# Patient Record
Sex: Male | Born: 1960 | Hispanic: No | Marital: Married | Smoking: Never smoker
Health system: Southern US, Community
[De-identification: ages and names within clinical notes are randomized; demographics above are authoritative.]

---

## 2006-03-31 ENCOUNTER — Encounter: Admission: RE | Admit: 2006-03-31 | Discharge: 2006-03-31 | Payer: Self-pay | Admitting: Specialist

## 2012-11-27 ENCOUNTER — Emergency Department (HOSPITAL_COMMUNITY): Payer: 59

## 2012-11-27 ENCOUNTER — Emergency Department (HOSPITAL_COMMUNITY)
Admission: EM | Admit: 2012-11-27 | Discharge: 2012-11-27 | Disposition: A | Discharge status: Home / Self Care | Payer: 59 | Attending: Emergency Medicine | Admitting: Emergency Medicine

## 2012-11-27 ENCOUNTER — Encounter (HOSPITAL_COMMUNITY): Payer: Self-pay | Admitting: Emergency Medicine

## 2012-11-27 DIAGNOSIS — S298XXA Other specified injuries of thorax, initial encounter: Secondary | ICD-10-CM | POA: Insufficient documentation

## 2012-11-27 DIAGNOSIS — S0993XA Unspecified injury of face, initial encounter: Secondary | ICD-10-CM | POA: Insufficient documentation

## 2012-11-27 DIAGNOSIS — R911 Solitary pulmonary nodule: Secondary | ICD-10-CM

## 2012-11-27 DIAGNOSIS — Y92410 Unspecified street and highway as the place of occurrence of the external cause: Secondary | ICD-10-CM | POA: Insufficient documentation

## 2012-11-27 DIAGNOSIS — IMO0002 Reserved for concepts with insufficient information to code with codable children: Secondary | ICD-10-CM | POA: Insufficient documentation

## 2012-11-27 DIAGNOSIS — Y9389 Activity, other specified: Secondary | ICD-10-CM | POA: Insufficient documentation

## 2012-11-27 DIAGNOSIS — S199XXA Unspecified injury of neck, initial encounter: Secondary | ICD-10-CM | POA: Insufficient documentation

## 2012-11-27 MED ORDER — IOHEXOL 300 MG/ML  SOLN
100.0000 mL | Freq: Once | INTRAMUSCULAR | Status: AC | PRN
  Administered 2012-11-27: 100 mL via INTRAVENOUS

## 2012-11-27 MED ORDER — DIAZEPAM 5 MG PO TABS: 5.0000 mg | ORAL_TABLET | Freq: Two times a day (BID) | ORAL | Status: DC

## 2012-11-27 MED ORDER — HYDROCODONE-ACETAMINOPHEN 5-325 MG PO TABS: 1.0000 | ORAL_TABLET | Freq: Four times a day (QID) | ORAL | Status: DC | PRN
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
## 2012-11-27 NOTE — ED Notes (Signed)
C-collar removed by PA Anne Shutter.
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
## 2012-11-27 NOTE — ED Notes (Signed)
Pt ambulatory to exam room with steady gait. Pt has c-collar in place.
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
## 2012-11-29 NOTE — ED Provider Notes (Signed)
Medical screening examination/treatment/procedure(s) were performed by non-physician practitioner and as supervising physician I was immediately available for consultation/collaboration.  Vivianne Carles, MD 11/29/12 2108 

## 2013-12-13 ENCOUNTER — Emergency Department (HOSPITAL_COMMUNITY)
Admission: EM | Admit: 2013-12-13 | Discharge: 2013-12-13 | Disposition: A | Discharge status: Home / Self Care | Payer: 59 | Attending: Emergency Medicine | Admitting: Emergency Medicine

## 2013-12-13 ENCOUNTER — Encounter (HOSPITAL_COMMUNITY): Payer: Self-pay | Admitting: Emergency Medicine

## 2013-12-13 ENCOUNTER — Emergency Department (HOSPITAL_COMMUNITY): Payer: 59

## 2013-12-13 DIAGNOSIS — J189 Pneumonia, unspecified organism: Secondary | ICD-10-CM

## 2013-12-13 DIAGNOSIS — R109 Unspecified abdominal pain: Secondary | ICD-10-CM | POA: Insufficient documentation

## 2013-12-13 DIAGNOSIS — M549 Dorsalgia, unspecified: Secondary | ICD-10-CM | POA: Insufficient documentation

## 2013-12-13 DIAGNOSIS — Z79899 Other long term (current) drug therapy: Secondary | ICD-10-CM | POA: Insufficient documentation

## 2013-12-13 DIAGNOSIS — J159 Unspecified bacterial pneumonia: Secondary | ICD-10-CM | POA: Insufficient documentation

## 2013-12-13 MED ORDER — IPRATROPIUM-ALBUTEROL 0.5-2.5 (3) MG/3ML IN SOLN
3.0000 mL | RESPIRATORY_TRACT | Status: DC
Start: 2013-12-13 — End: 2013-12-13
  Administered 2013-12-13: 3 mL via RESPIRATORY_TRACT
  Filled 2013-12-13: qty 3

## 2013-12-13 MED ORDER — AZITHROMYCIN 250 MG PO TABS: 250.0000 mg | ORAL_TABLET | Freq: Every day | ORAL | Status: DC

## 2013-12-13 NOTE — Discharge Instructions (Signed)
Call for a follow up appointment with a Family or Primary Care Provider.  °Return to the Emergency Department if Symptoms worsen.   °Take medication as prescribed.  ° ° °Emergency Department Resource Guide °1) Find a Doctor and Pay Out of Pocket °Although you won't have to find out who is covered by your insurance plan, it is a good idea to ask around and get recommendations. You will then need to call the office and see if the doctor you have chosen will accept you as a new patient and what types of options they offer for patients who are self-pay. Some doctors offer discounts or will set up payment plans for their patients who do not have insurance, but you will need to ask so you aren't surprised when you get to your appointment. ° °2) Contact Your Local Health Department °Not all health departments have doctors that can see patients for sick visits, but many do, so it is worth a call to see if yours does. If you don't know where your local health department is, you can check in your phone book. The CDC also has a tool to help you locate your state's health department, and many state websites also have listings of all of their local health departments. ° °3) Find a Walk-in Clinic °If your illness is not likely to be very severe or complicated, you may want to try a walk in clinic. These are popping up all over the country in pharmacies, drugstores, and shopping centers. They're usually staffed by nurse practitioners or physician assistants that have been trained to treat common illnesses and complaints. They're usually fairly quick and inexpensive. However, if you have serious medical issues or chronic medical problems, these are probably not your best option. ° °No Primary Care Doctor: °- Call [facility] at  832-8000 - they can help you locate a primary care doctor that  accepts your insurance, provides certain services, etc. °- Physician Referral Service- 1-800-533-3463 ° °Chronic Pain  Problems: °Organization         Address  Phone   Notes  °[facility]  (336) 297-2271 Patients need to be referred by their primary care doctor.  ° °Medication Assistance: °Organization         Address  Phone   Notes  °[place] [facility] [address] °[place], [place] [address] (336) 641-8030 --Must be a resident of [place] °-- Must have NO insurance coverage whatsoever (no Medicaid/ Medicare, etc.) °-- The pt. MUST have a primary care doctor that directs their care regularly and follows them in the community °  °[facility]  (866) 331-1348   °[employer]  (888) 892-1162   ° °Agencies that provide inexpensive medical care: °Organization         Address  Phone   Notes  °[facility]  (336) 832-8035   ° Internal Medicine    (336) 832-7272   °[facility] [address] °[place], [place] [address] (336) 832-4777   °[facility] [address], °[place] (336) 271-4999   °[facility]    (336) 373-0678   °[facility]    (336) 272-1050   °[facility] ° [address],  Phone:  (336) 832-4444, Fax:  (336) 832-4440 Hours of Operation:  9 am - 6 pm, M-F.  Also accepts Medicaid/Medicare and self-pay.  °[facility] ° [address], [place] Phone: (336) 832-3150, Fax: (336) 832-3151. Hours of Operation:  8:30 am - 5:30 pm, M-F.  Also accepts Medicaid and self-pay.  °[facility] [place] [address], [place] Phone: (336) 878-6027   °[facility] [address], [place], [place] (336)723-1848, Ext. 123 Mondays & Thursdays: 7-9 AM.  First 15 patients are seen on a first come, first serve basis. °  ° °Medicaid-accepting [place] Providers: ° °Organization         Address  Phone   Notes  °[facility] [address], [place] (336) 641-2100 Also  accepts self-pay patients.  °[facility] [address], [place] ° (336) 856-9996   °[facility] [address], [place] (336) 288-8857   °[facility] [address] [place] [address], [place] (336) 299-7000   °Veita Bland [address], [place]  ° (336) 373-1557 Only accepts [place] Access Medicaid patients after they have their name applied to their card.  ° °Self-Pay (no insurance) in [place]: ° °Organization         Address  Phone   Notes  °Sickle Cell Patients, [facility] [address], [place] (336) 832-1970   °[facility] [address], [place] (336) 832-4400   °[facility] ° [address], [place] (336) 992-4800   °[facility]/Dr. Osei-Bonsu ° [address] [place] [address], [place] or [address], [place] (336) 841-8500 Phone number for both [place] and [place] locations is the same.  °[facility] [address], [place] (336) 299-0000   °[facility] [address] [place] [address], [place] or [address] (336) 852-7530 °(336) 878-2260   °[facility] [address], [place] (336) 350-1642, phone; (336) 294-5005, fax Sees patients 1st and 3rd Saturday of every month.  Must not qualify for public or private insurance (i.e. Medicaid, Medicare, [place] Health Choice, Veterans' Benefits) • Household income should be no more than 200% of the poverty level •The clinic cannot treat you if you are pregnant or think you are pregnant • Sexually transmitted diseases are not treated at the clinic.  ° ° °Dental Care: °Organization         Address  Phone  Notes  °[place] [facility] [address],  (336) 641-6152 Accepts children up to age 21 who are enrolled in Medicaid or [place] Health Choice; pregnant  women with a Medicaid card; and children who have applied for Medicaid or [place] Health Choice, but were declined, whose parents can pay a reduced fee at time of service.  °[facility] [place]  [address], [place] (336) 641-7733 Accepts children up to age 21 who are enrolled in Medicaid or [place] Health Choice; pregnant women with a Medicaid card; and children who have applied for Medicaid or [place] Health Choice, but were declined, whose parents can pay a reduced fee at time of service.  °[facility] ° [address],  (336) 641-4533 Patients are seen by appointment only. Walk-ins are not accepted. [facility] will see patients 18 years of age and older. °Monday - Tuesday (8am-5pm) °Most Wednesdays (8:30-5pm) °$30 per visit, cash only  °[facility] °   [address], [place] (336) 641-4533 Patients are seen by appointment only. Walk-ins are not accepted. [facility] will see patients 18 years of age and older. °One Wednesday Evening (Monthly: Volunteer Based).  $30 per visit, cash only  °[facility]  (919) 537-3737 for adults; Children under age 4, call Graduate Pediatric Dentistry at (919) [phone]. Children aged 4-14, please call (919) [phone] to request a pediatric application. ° Dental services are provided in all areas of dental care including fillings, crowns and bridges, complete and partial dentures, implants, gum treatment, root canals, and extractions. Preventive care is also provided. Treatment is provided to both adults and children. °Patients are selected via a lottery and there is often a waiting list. °  °[facility] [address], °[place] ° (336) 763-8833 [email or web address] °  °[facility] [address], [place], [place] (336)723-1848, Ext. 123 Second and Fourth Thursday of each month, opens at 6:30 AM; Clinic ends at 9 AM.  Patients are  seen on a first-come first-served basis, and a limited number are seen during each clinic.  ° °[facility] ° [address], [place], [place] (336) 723-7904   Eligibility Requirements °You must have lived in [place], [place], or [place] counties for at least the last three months. °  You cannot be eligible for state or federal sponsored healthcare insurance, including Veterans Administration, Medicaid, or Medicare. °  You generally cannot be eligible for healthcare insurance through your employer.  °  How to apply: °Eligibility screenings are held every Tuesday and Wednesday afternoon from 1:00 pm until 4:00 pm. You do not need an appointment for the interview!  °[facility] [address], [place], [place] 336-631-2330   °[facility] [facility]  336-342-8273   °[place] [facility]  336-703-3100   °[facility]  336-570-6415   ° °Behavioral Health Resources in the Community: °Intensive Outpatient Programs °Organization         Address  Phone  Notes  °[facility] [address], [place], [place] 336-878-6098   °[facility] [address], [place], [place] 336-832-9800   °[facility] [address], [place], [place] ° 336-882-2125   °[facility] [address],  °[place], [place] 1-800-853-5163 or 336-641-4981   °Substance Abuse Resources °Organization         Address  Phone  Notes  °[facility]  336-882-2125   °[facility]  336-784-9470   °[facility]  336-285-9073   °[facility]  336-845-3988   °Residential & Outpatient Substance Abuse Program  1-800-659-3381   °Psychological Services °Organization         Address  Phone  Notes  °[facility]  336- 832-9600   °[facility]  336- 378-7881   °[facility] [address], [place] 1-800-853-5163 or 336-641-4981   ° °Mobile Crisis  Teams °Organization         Address  Phone  Notes  °[facility], Mobile Crisis Care Unit  1-877-626-1772   °Assertive °[facility] ° [address] [place], [place] 336-834-9664   °Sharon DeEsch [address] °[place]  336-554-5454   ° °Self-Help/Support Groups °Organization         Address  Phone             Notes  °[facility] [place] -   variety of support groups  336- 373-1402 Call for more information  °Narcotics Anonymous (NA), [facility] [address], °[place]  2 meetings at this location  ° °Residential Treatment Programs °Organization         Address  Phone  Notes  °[facility] [address],    °[place]  1-866-801-8205   °[facility] ° [address], [place], [place] 704-293-8524   °[facility] [address], [place] 336-845-3988 Admissions: 8am-3pm M-F  °[facility] [address]    °[place], [place] 336-841-1104   °[facility] [address], [place], [place] 336-379-7146   °[facility] [address]  °[place], [place] 336-285-9073   °[facility] - Intensive Outpatient [address], [place], [place] 336-852-3033   °[facility] [address]  °[place], [place] 1-877-615-2722 or 336-784-9470   °[facility] [address] [place], [place] 336-227-7417 Accepts Medicaid  °[facility] [address]  °[place] 1-800-659-3381 Substance Abuse/Addiction Treatment  ° °[place] Behavioral Health Resources °Organization         Address  Phone  Notes  °[facility]  (888) 581-9988   °Julie Brannon, PhD [address] [place], [place]   (336) 349-5553 or (336) 951-0000   °[facility]   [address] °[place], [place] (336) 349-4454   °[facility] [address], [place], [place] (336) 342-8316  Insurance/Medicaid/sponsorship through [facility]  °[facility] [address]                                    [place], [place] (336) 342-8316 Therapy/tele-psych/case  °[facility] [address]  ° [place], [place] (336) 349-2233    °Dr. Arfeen  (336) 349-4544   °[facility] [place]  [employer] [place] [facility] 1) [address], [place] °2) [address], [place] °3)  [address], [place] (336) 349-3220 °(336) 342-7768 ° °(336) 342-8140   °[place] [facility] (336) 342-1394 or (336) 342-3537 (After Hours)    ° ° ° ° °

## 2013-12-13 NOTE — ED Provider Notes (Signed)
CSN: [id number]     Arrival date & time 12/13/13  1707 History  This chart was scribed for non-physician practitioner Clabe Seal, PA-C working with Flint Melter, MD by Leone Payor, ED Scribe. This patient was seen in room TR09C/TR09C and the patient's care was started at 7:00PM.    Chief Complaint  Patient presents with  . Cough    The history is provided by the patient. No language interpreter was used.    HPI Comments: Drew Williamson is a 53 y.o. male who presents to the Emergency Department complaining of 2.5 weeks of gradual onset, constant, unchanged cough productive of mucus. He also reports having subjective fevers, chills, rhinorrhea. He reports having abdominal pain and back pain from coughing. He denies nausea, vomiting, SOB. Pt is a non-smoker. He denies history of DM.  History reviewed. No pertinent past medical history. History reviewed. No pertinent past surgical history. History reviewed. No pertinent family history. History  Substance Use Topics  . Smoking status: Never Smoker   . Smokeless tobacco: Not on file  . Alcohol Use: Yes     Comment: socially    Review of Systems  Constitutional: Positive for fever (subjective) and chills.  HENT: Positive for rhinorrhea.   Respiratory: Positive for cough. Negative for shortness of breath.   Cardiovascular: Positive for chest pain (after coughing).  Gastrointestinal: Positive for abdominal pain (after coughing).  All other systems reviewed and are negative.    Allergies  Review of patient's allergies indicates no known allergies.  Home Medications   Current Outpatient Rx  Name  Route  Sig  Dispense  Refill  . ibuprofen (ADVIL,MOTRIN) 200 MG tablet   Oral   Take 200 mg by mouth every 6 (six) hours as needed.         . Pseudoeph-Doxylamine-DM-APAP (NYQUIL MULTI-SYMPTOM PO)   Oral   Take 1 tablet by mouth daily.         Marland Kitchen azithromycin (ZITHROMAX) 250 MG tablet   Oral   Take 1 tablet (250 mg total) by  mouth daily. Take first 2 tablets together, then 1 every day until finished.   6 tablet   0    BP 161/84  Pulse 88  Temp(Src) 97.7 F (36.5 C) (Oral)  Resp 20  SpO2 95% Physical Exam  Nursing note and vitals reviewed. Constitutional: He is oriented to person, place, and time. He appears well-developed and well-nourished.  HENT:  Head: Normocephalic and atraumatic.  Neck: Neck supple.  Cardiovascular: Normal rate, regular rhythm and normal heart sounds.  Exam reveals no gallop and no friction rub.   No murmur heard. Pulmonary/Chest: Effort normal. Not tachypneic. No respiratory distress. He has no decreased breath sounds. He has wheezes (inspiratory). He has rhonchi. He exhibits no tenderness.  Patient is able to speak in complete sentences.    Abdominal: Soft. He exhibits no distension. There is no tenderness.  Neurological: He is alert and oriented to person, place, and time.  Skin: Skin is warm and dry.  Psychiatric: He has a normal mood and affect. His behavior is normal.    ED Course  Procedures (including critical care time)    COORDINATION OF CARE: 7:05 PM Will order breathing treatment here and prescribe Z-pack and inhaler. Discussed treatment plan with pt at bedside and pt agreed to plan.   Labs Review Labs Reviewed - No data to display Imaging Review Dg Chest 2 View  12/13/2013   CLINICAL DATA:  Cough, chest pain  EXAM:  CHEST - 2 VIEW  COMPARISON:  11/27/2012  FINDINGS: Focal airspace consolidation is now noted at the right lung base. Chronic blunting of the left costophrenic angle versus small effusion. Coarse interstitial opacities in the mid and lower left lung. Heart size remains normal.  Visualized skeletal structures are unremarkable.  IMPRESSION: 1. Focal airspace consolidation in the right lower lobe suggesting pneumonia. 2. Chronic left pleural effusion or scarring.   Electronically Signed   By: Oley Balmaniel  Hassell M.D.   On: 12/13/2013 18:17    EKG  Interpretation   None       MDM   1. Community acquired pneumonia    Pt with several weeks of productive cough and subjective fevers.  XR shows Right lower lobe consolidation. Rhonchi and wheezing on exam, will give a breathing treatment in the ED. 2000: re-eval patient reports symptom improvement.  No wheezing or rhonchi throughout lung fields on auscultation.  Will treat for CAP. Discussed lab results, imaging results, and treatment plan with the patient and the patient's wife. Return precautions given. Reports understanding and no other concerns at this time.  Patient is stable for discharge at this time.  Meds given in ED:  Medications - No data to display  Discharge Medication List as of 12/13/2013  8:04 PM    START taking these medications   Details  azithromycin (ZITHROMAX) 250 MG tablet Take 1 tablet (250 mg total) by mouth daily. Take first 2 tablets together, then 1 every day until finished., Starting 12/13/2013, Until Discontinued, Print        I personally performed the services described in this documentation, which was scribed in my presence. The recorded information has been reviewed and is accurate.    Clabe SealLauren M Georgios Kina, PA-C 12/14/13 1452

## 2013-12-13 NOTE — ED Notes (Signed)
Pt reports productive cough with clear colored sputum and rib/back pain when coughing x3 weeks. Pt reports taking OTC cough suppressants with no relief

## 2013-12-15 NOTE — ED Provider Notes (Signed)
Medical screening examination/treatment/procedure(s) were performed by non-physician practitioner and as supervising physician I was immediately available for consultation/collaboration.  Flint MelterElliott L Chantale Leugers, MD 12/15/13 [id number]
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
## 2015-04-23 IMAGING — CR DG CHEST 2V
2 series · 2 of 2 positions shown · non-contrast
Comparison: 11/27/2012

CLINICAL DATA: Cough, chest pain

EXAM:
CHEST - 2 VIEW

[w chest pa]
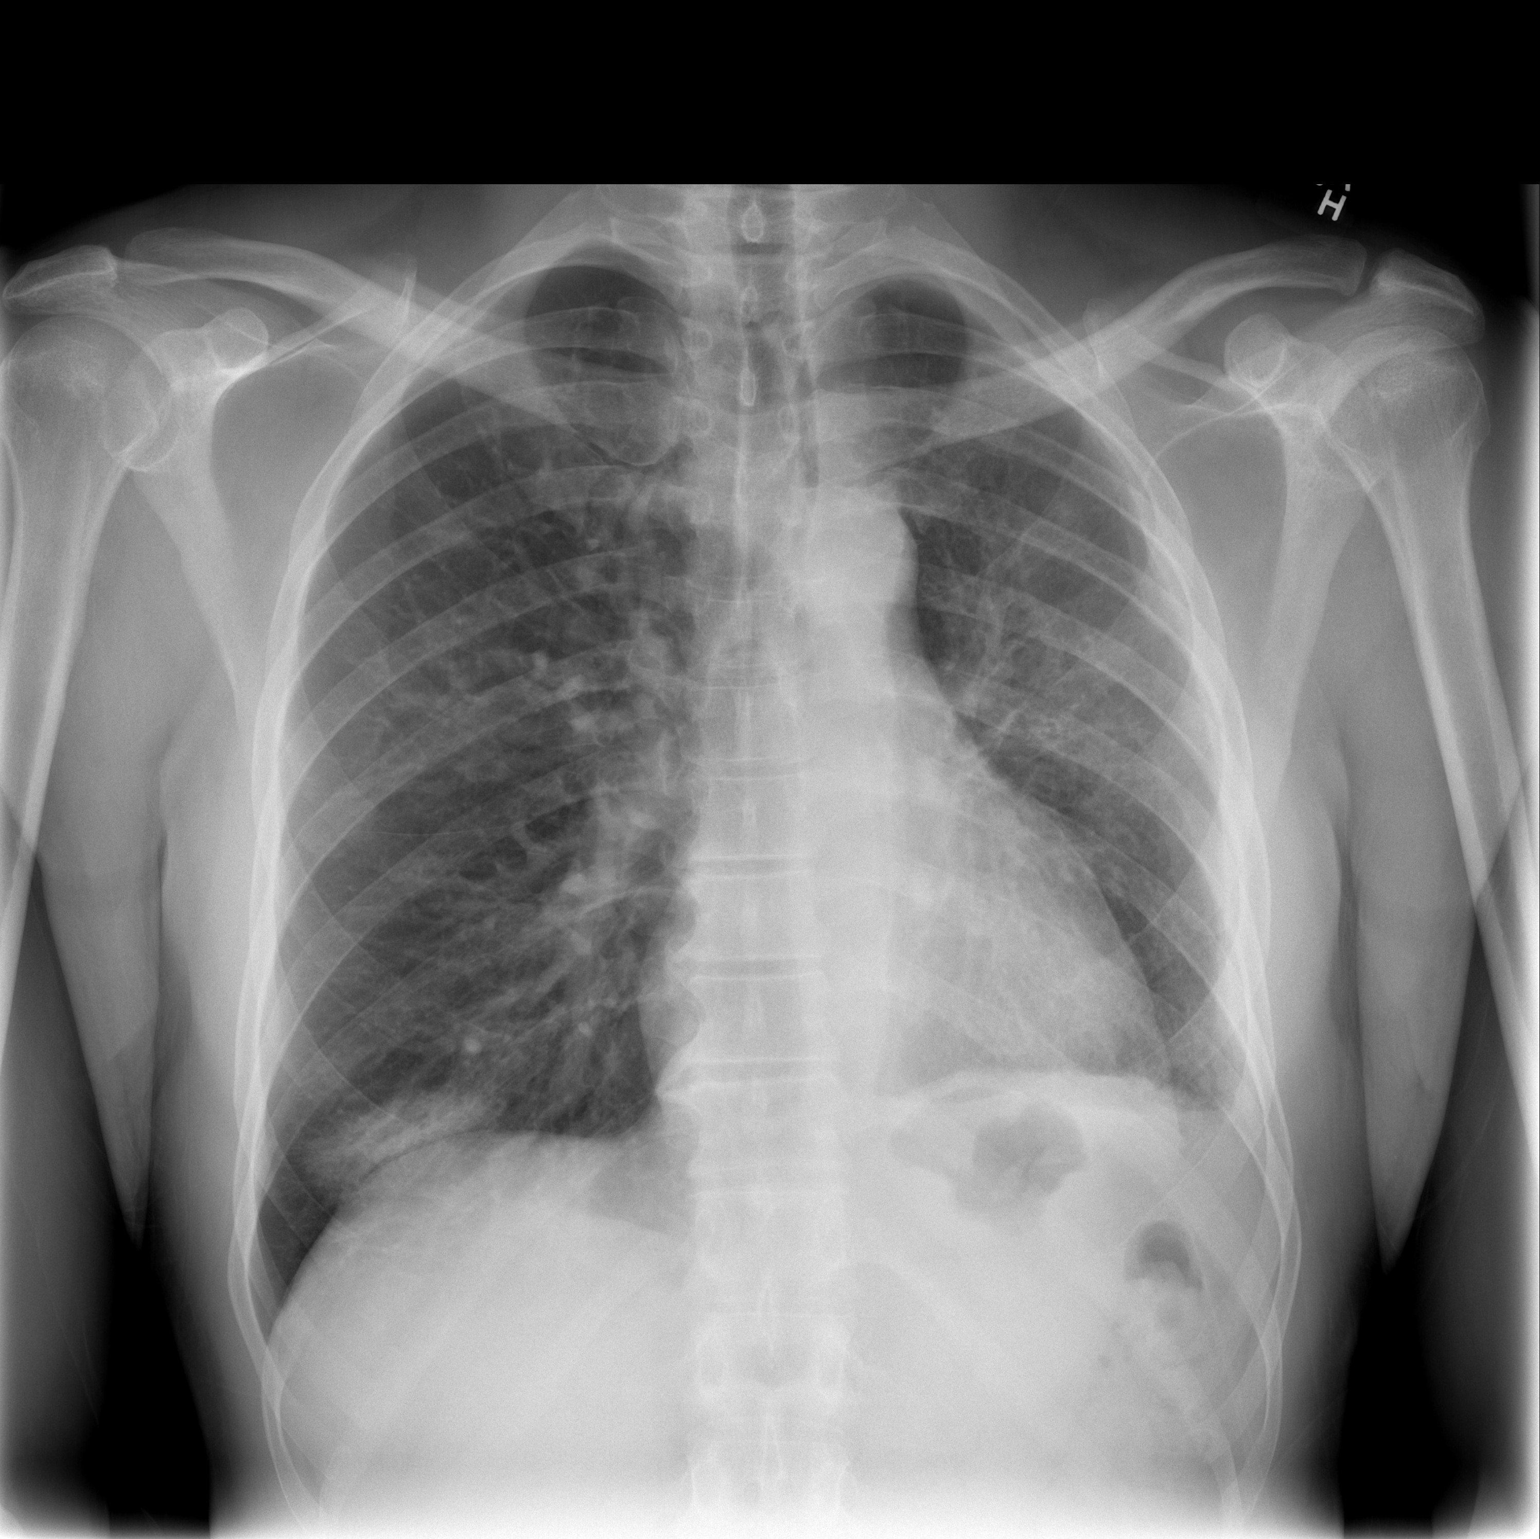

[w chest lat]
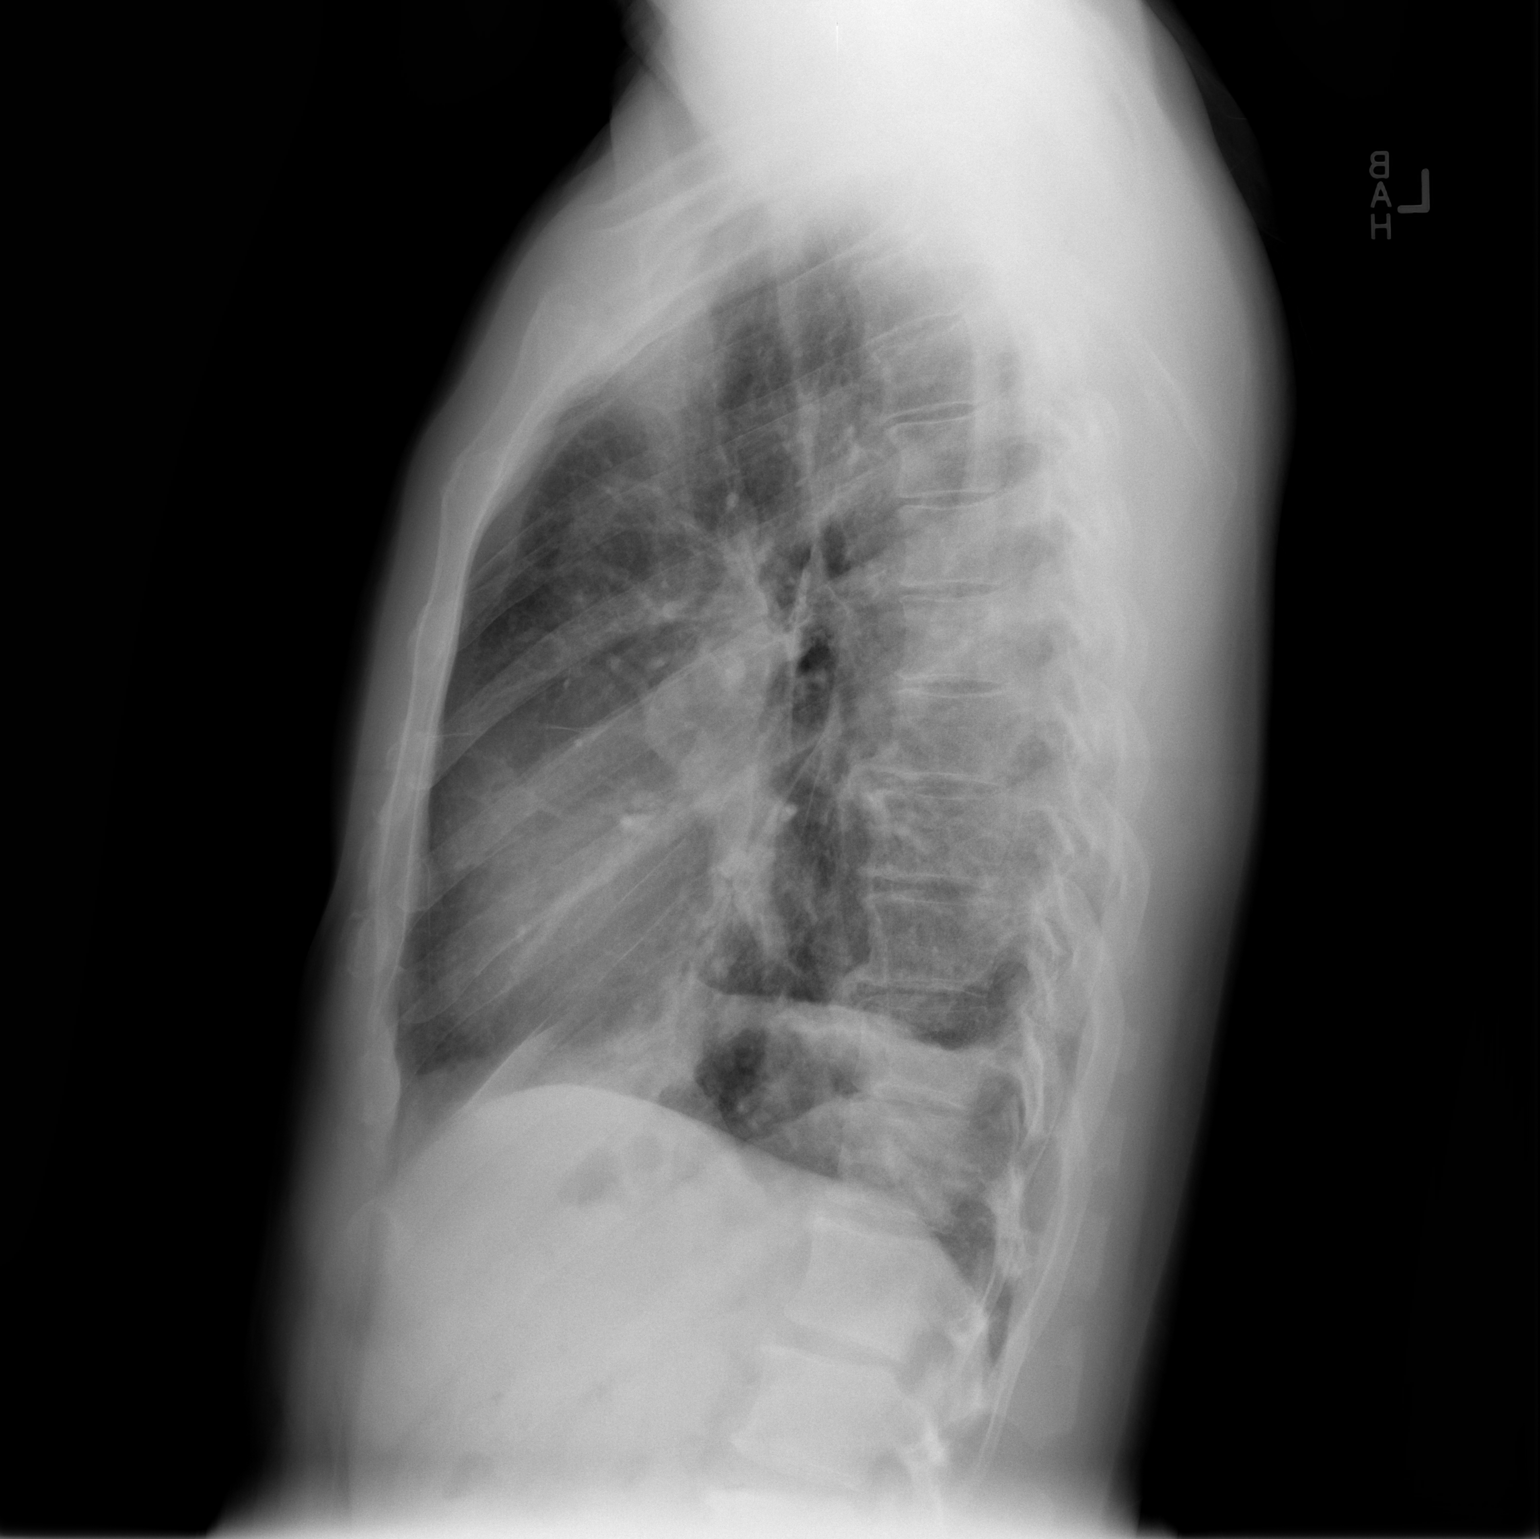

[2 of 2 positions shown; findings below may reference images not displayed]

FINDINGS: Focal airspace consolidation is now noted at the right lung base.
Chronic blunting of the left costophrenic angle versus small
effusion. Coarse interstitial opacities in the mid and lower left
lung. Heart size remains normal.

Visualized skeletal structures are unremarkable.
IMPRESSION: 1. Focal airspace consolidation in the right lower lobe suggesting
pneumonia.
2. Chronic left pleural effusion or scarring.

## 2017-02-24 ENCOUNTER — Emergency Department (HOSPITAL_COMMUNITY)
Admission: EM | Admit: 2017-02-24 | Discharge: 2017-02-24 | Disposition: A | Discharge status: Home / Self Care | Payer: Worker's Compensation | Attending: Emergency Medicine | Admitting: Emergency Medicine

## 2017-02-24 ENCOUNTER — Ambulatory Visit (INDEPENDENT_AMBULATORY_CARE_PROVIDER_SITE_OTHER): Payer: Worker's Compensation | Admitting: Physician Assistant

## 2017-02-24 ENCOUNTER — Encounter (HOSPITAL_COMMUNITY): Payer: Self-pay

## 2017-02-24 VITALS — BP 163/92 | HR 69 | Temp 98.5°F | Resp 16 | Ht 67.0 in | Wt 163.0 lb

## 2017-02-24 DIAGNOSIS — Y9389 Activity, other specified: Secondary | ICD-10-CM | POA: Insufficient documentation

## 2017-02-24 DIAGNOSIS — S01511A Laceration without foreign body of lip, initial encounter: Secondary | ICD-10-CM

## 2017-02-24 DIAGNOSIS — Y990 Civilian activity done for income or pay: Secondary | ICD-10-CM | POA: Diagnosis not present

## 2017-02-24 DIAGNOSIS — S0993XA Unspecified injury of face, initial encounter: Secondary | ICD-10-CM

## 2017-02-24 DIAGNOSIS — Y929 Unspecified place or not applicable: Secondary | ICD-10-CM | POA: Insufficient documentation

## 2017-02-24 DIAGNOSIS — W228XXA Striking against or struck by other objects, initial encounter: Secondary | ICD-10-CM | POA: Diagnosis not present

## 2017-02-24 MED ORDER — LIDOCAINE HCL (PF) 1 % IJ SOLN
5.0000 mL | Freq: Once | INTRAMUSCULAR | Status: AC
  Administered 2017-02-24: 5 mL
  Filled 2017-02-24: qty 5

## 2017-02-24 NOTE — Patient Instructions (Addendum)
  Please go to the emergency department.  I will call ahead.  Unfortunately, we will have to close this by a plastic surgeon or a maxillofacial surgeon.     IF you received an x-ray today, you will receive an invoice from [facility]. Please contact [facility] at [phone] with questions or concerns regarding your invoice.   IF you received lab work today, you will receive an invoice from [employer]. Please contact [employer] at [phone] with questions or concerns regarding your invoice.   Our billing staff will not be able to assist you with questions regarding bills from these companies.  You will be contacted with the lab results as soon as they are available. The fastest way to get your results is to activate your My Chart account. Instructions are located on the last page of this paperwork. If you have not heard from [place] regarding the results in 2 weeks, please contact this office.

## 2017-02-24 NOTE — ED Notes (Signed)
,  Pt stable, understands discharge instructions, and reasons for return.   

## 2017-02-24 NOTE — ED Triage Notes (Signed)
Per Pt, Pt was at work when a machine came and hit him in the mouth. Pt has 1 cm laceration noted to the upper lip. NO damage done to teeth.

## 2017-02-24 NOTE — ED Provider Notes (Signed)
[facility]-EMERGENCY DEPT Provider Note   CSN: [id number] Arrival date & time: 02/24/17  1710   By signing my name below, I, Teofilo Pod, attest that this documentation has been prepared under the direction and in the presence of Kerrie Buffalo, NP. Electronically Signed: Teofilo Pod, ED Scribe. 02/24/2017. 7:04 PM.   History   Chief Complaint Chief Complaint  Patient presents with  . Lip Laceration   The history is provided by the patient. No language interpreter was used.   HPI Comments:  Drew Williamson is a 56 y.o. male who presents to the Emergency Department, here due to a laceration of the upper lip sustained PTA. Pt reports that he was at work using a rotating brush, and the brush flew up in to his face and cut his upper lip on the inside. He notes no dental damage. Pt has washed out his mouth. Tetanus UTD. Bleeding is controlled. Pt denies other associated symptoms.    History reviewed. No pertinent past medical history.  There are no active problems to display for this patient.   History reviewed. No pertinent surgical history.     Home Medications    Prior to Admission medications   Medication Sig Start Date End Date Taking? Authorizing Provider  ibuprofen (ADVIL,MOTRIN) 200 MG tablet Take 200 mg by mouth every 6 (six) hours as needed.    Historical Provider, MD    Family History No family history on file.  Social History Social History  Substance Use Topics  . Smoking status: Never Smoker  . Smokeless tobacco: Never Used  . Alcohol use Yes     Comment: socially     Allergies   Patient has no known allergies.   Review of Systems Review of Systems  HENT: Negative for dental problem.   Skin: Positive for wound.     Physical Exam Updated Vital Signs BP (!) 141/91 (BP Location: Right Arm)   Pulse 71   Temp 99.1 F (37.3 C) (Oral)   Resp 17   Ht  (1.702 m)   Wt 73.9 kg   SpO2 100%   BMI 25.53 kg/m   Physical Exam    Constitutional: He appears well-developed and well-nourished. No distress.  HENT:  Head: Normocephalic and atraumatic.  Eyes: Conjunctivae and EOM are normal. Pupils are equal, round, and reactive to light.  Neck: Normal range of motion.  Cardiovascular: Normal rate.   Pulmonary/Chest: Effort normal.  Abdominal: He exhibits no distension.  Musculoskeletal:  Rull ROM of arms, no tenderness.  Neurological: He is alert.  Skin: Skin is warm and dry.  2cm gaping wound inside the upper mid lip.   Psychiatric: He has a normal mood and affect.  Nursing note and vitals reviewed.    ED Treatments / Results  DIAGNOSTIC STUDIES:  Oxygen Saturation is 100% on RA, normal by my interpretation.    COORDINATION OF CARE:  7:02 PM Will repair laceration. Discussed treatment plan with pt at bedside and pt agreed to plan.   Labs (all labs ordered are listed, but only abnormal results are displayed) Labs Reviewed - No data to display  Radiology No results found.  Procedures .Marland KitchenLaceration Repair Date/Time: 02/24/2017 8:00 PM Performed by: Janne Napoleon Authorized by: Janne Napoleon   Consent:    Consent obtained:  Verbal   Consent given by:  Patient   Risks discussed:  Infection and pain   Alternatives discussed:  No treatment Anesthesia (see MAR for exact dosages):  Anesthesia method:  Local infiltration   Local anesthetic:  Lidocaine 1% w/o epi Laceration details:    Location:  Lip   Lip location:  Upper interior lip   Length (cm):  2 Repair type:    Repair type:  Simple Pre-procedure details:    Preparation:  Patient was prepped and draped in usual sterile fashion Exploration:    Wound exploration: entire depth of wound probed and visualized     Contaminated: no   Treatment:    Area cleansed with:  Saline   Irrigation solution:  Sterile saline   Irrigation method:  Syringe Skin repair:    Repair method:  Sutures   Suture size:  5-0   Wound skin closure material used:  vicryl.   Suture technique:  Simple interrupted   Number of sutures:  3 Approximation:    Approximation:  Loose   Vermilion border: well-aligned   Post-procedure details:    Dressing:  Open (no dressing)   Patient tolerance of procedure:  Tolerated well, no immediate complications   (including critical care time)  Medications Ordered in ED Medications  lidocaine (PF) (XYLOCAINE) 1 % injection 5 mL (5 mLs Infiltration Given 02/24/17 1909)     Initial Impression / Assessment and Plan / ED Course  I have reviewed the triage vital signs and the nursing notes.  Pressure irrigation performed. Wound explored and base of wound visualized in a bloodless field without evidence of foreign body.  Laceration occurred < 8 hours prior to repair which was well tolerated. Tdap updated.  Pt has no comorbidities to effect normal wound healing.  Discussed suture home care with patient and answered questions. Pt to follow-up for any problems or signs of infection. Pt is hemodynamically stable with no complaints prior to dc.    Final Clinical Impressions(s) / ED Diagnoses   Final diagnoses:  Lip laceration, initial encounter    New Prescriptions Discharge Medication List as of 02/24/2017  7:43 PM    I personally performed the services described in this documentation, which was scribed in my presence. The recorded information has been reviewed and is accurate.    [place], NP 02/25/17 1823    Nira Conn, MD 02/26/17 0130

## 2017-02-24 NOTE — Discharge Instructions (Signed)
Take tylenol or ibuprofen as needed for pain. The sutures will absorb and do not need to be removed. Return as needed for any problems.

## 2017-03-01 NOTE — Progress Notes (Signed)
     Drew Williamson [birth year] 56 y.o.   Chief Complaint  Patient presents with  . Oral Swelling    Workers comp. Pt hit himself with metal    Presents for evaluation of work-related complaint.  Date of Injury: 02/24/2017  History of Present Illness:  Patient was working when a pole popped back and upward, colliding into his lip.  Bleeding was immediate.  No teeth pain.  Swollen.    ROS ROS otherwise unremarkable unless listed above.   Current medications and allergies reviewed and updated. Past medical history, family history, social history have been reviewed and updated.   Physical Exam  Constitutional: He is well-developed, well-nourished, and in no distress. No distress.  HENT:  Head: Normocephalic.  Upper lip at center there is a deep gash that extends 1cm in the wet vermillion, and just into the external (dry vermillion) lip tissue.    Skin: He is not diaphoretic.     Assessment and Plan: This will need to be repaired by plastic or maxillofacial likely.  Consult with plastic, and was advised that ED with on-call consult would be most warranted.  Lip injury, initial encounter  Lip laceration, initial encounter  Trena Platt, PA-C [facility] 4/15/20189:12 PM

## 2017-03-16 DIAGNOSIS — Z13220 Encounter for screening for lipoid disorders: Secondary | ICD-10-CM | POA: Diagnosis not present

## 2017-03-16 DIAGNOSIS — Z0000 Encounter for general adult medical examination without abnormal findings: Secondary | ICD-10-CM | POA: Diagnosis not present

## 2017-12-11 DIAGNOSIS — M109 Gout, unspecified: Secondary | ICD-10-CM | POA: Diagnosis not present

## 2017-12-11 DIAGNOSIS — M533 Sacrococcygeal disorders, not elsewhere classified: Secondary | ICD-10-CM | POA: Diagnosis not present

## 2018-02-15 ENCOUNTER — Encounter: Payer: Self-pay | Admitting: Physician Assistant

## 2018-04-02 DIAGNOSIS — Z0000 Encounter for general adult medical examination without abnormal findings: Secondary | ICD-10-CM | POA: Diagnosis not present

## 2018-04-02 DIAGNOSIS — M109 Gout, unspecified: Secondary | ICD-10-CM | POA: Diagnosis not present

## 2018-04-02 DIAGNOSIS — Z13220 Encounter for screening for lipoid disorders: Secondary | ICD-10-CM | POA: Diagnosis not present

## 2018-08-06 DIAGNOSIS — Z23 Encounter for immunization: Secondary | ICD-10-CM | POA: Diagnosis not present

## 2018-09-22 DIAGNOSIS — M67911 Unspecified disorder of synovium and tendon, right shoulder: Secondary | ICD-10-CM | POA: Diagnosis not present

## 2018-09-22 DIAGNOSIS — M67912 Unspecified disorder of synovium and tendon, left shoulder: Secondary | ICD-10-CM | POA: Diagnosis not present

## 2018-09-29 DIAGNOSIS — M25511 Pain in right shoulder: Secondary | ICD-10-CM | POA: Diagnosis not present

## 2018-09-29 DIAGNOSIS — M25512 Pain in left shoulder: Secondary | ICD-10-CM | POA: Diagnosis not present

## 2018-10-08 DIAGNOSIS — M75122 Complete rotator cuff tear or rupture of left shoulder, not specified as traumatic: Secondary | ICD-10-CM | POA: Diagnosis not present

## 2018-10-08 DIAGNOSIS — M75121 Complete rotator cuff tear or rupture of right shoulder, not specified as traumatic: Secondary | ICD-10-CM | POA: Diagnosis not present

## 2018-11-30 DIAGNOSIS — G8918 Other acute postprocedural pain: Secondary | ICD-10-CM | POA: Diagnosis not present

## 2018-11-30 DIAGNOSIS — M75121 Complete rotator cuff tear or rupture of right shoulder, not specified as traumatic: Secondary | ICD-10-CM | POA: Diagnosis not present

## 2018-11-30 DIAGNOSIS — M7541 Impingement syndrome of right shoulder: Secondary | ICD-10-CM | POA: Diagnosis not present

## 2018-11-30 DIAGNOSIS — M7521 Bicipital tendinitis, right shoulder: Secondary | ICD-10-CM | POA: Diagnosis not present

## 2019-01-11 DIAGNOSIS — M75121 Complete rotator cuff tear or rupture of right shoulder, not specified as traumatic: Secondary | ICD-10-CM | POA: Diagnosis not present

## 2019-01-13 DIAGNOSIS — M75121 Complete rotator cuff tear or rupture of right shoulder, not specified as traumatic: Secondary | ICD-10-CM | POA: Diagnosis not present

## 2019-01-17 DIAGNOSIS — M75121 Complete rotator cuff tear or rupture of right shoulder, not specified as traumatic: Secondary | ICD-10-CM | POA: Diagnosis not present

## 2019-01-20 DIAGNOSIS — M75121 Complete rotator cuff tear or rupture of right shoulder, not specified as traumatic: Secondary | ICD-10-CM | POA: Diagnosis not present

## 2019-01-24 DIAGNOSIS — M75121 Complete rotator cuff tear or rupture of right shoulder, not specified as traumatic: Secondary | ICD-10-CM | POA: Diagnosis not present

## 2019-01-27 DIAGNOSIS — M75121 Complete rotator cuff tear or rupture of right shoulder, not specified as traumatic: Secondary | ICD-10-CM | POA: Diagnosis not present

## 2019-01-31 DIAGNOSIS — M75121 Complete rotator cuff tear or rupture of right shoulder, not specified as traumatic: Secondary | ICD-10-CM | POA: Diagnosis not present

## 2019-02-03 DIAGNOSIS — M75121 Complete rotator cuff tear or rupture of right shoulder, not specified as traumatic: Secondary | ICD-10-CM | POA: Diagnosis not present

## 2019-02-07 DIAGNOSIS — M75121 Complete rotator cuff tear or rupture of right shoulder, not specified as traumatic: Secondary | ICD-10-CM | POA: Diagnosis not present

## 2019-02-11 DIAGNOSIS — M75121 Complete rotator cuff tear or rupture of right shoulder, not specified as traumatic: Secondary | ICD-10-CM | POA: Diagnosis not present

## 2019-02-15 DIAGNOSIS — M75121 Complete rotator cuff tear or rupture of right shoulder, not specified as traumatic: Secondary | ICD-10-CM | POA: Diagnosis not present

## 2019-03-02 DIAGNOSIS — M75121 Complete rotator cuff tear or rupture of right shoulder, not specified as traumatic: Secondary | ICD-10-CM | POA: Diagnosis not present

## 2019-03-07 DIAGNOSIS — M75121 Complete rotator cuff tear or rupture of right shoulder, not specified as traumatic: Secondary | ICD-10-CM | POA: Diagnosis not present

## 2019-03-09 DIAGNOSIS — M75121 Complete rotator cuff tear or rupture of right shoulder, not specified as traumatic: Secondary | ICD-10-CM | POA: Diagnosis not present

## 2019-03-14 DIAGNOSIS — M75121 Complete rotator cuff tear or rupture of right shoulder, not specified as traumatic: Secondary | ICD-10-CM | POA: Diagnosis not present

## 2019-03-16 DIAGNOSIS — M75121 Complete rotator cuff tear or rupture of right shoulder, not specified as traumatic: Secondary | ICD-10-CM | POA: Diagnosis not present

## 2019-03-22 DIAGNOSIS — M75121 Complete rotator cuff tear or rupture of right shoulder, not specified as traumatic: Secondary | ICD-10-CM | POA: Diagnosis not present

## 2019-03-23 DIAGNOSIS — M75121 Complete rotator cuff tear or rupture of right shoulder, not specified as traumatic: Secondary | ICD-10-CM | POA: Diagnosis not present

## 2019-03-29 DIAGNOSIS — M75121 Complete rotator cuff tear or rupture of right shoulder, not specified as traumatic: Secondary | ICD-10-CM | POA: Diagnosis not present

## 2019-03-31 DIAGNOSIS — M75121 Complete rotator cuff tear or rupture of right shoulder, not specified as traumatic: Secondary | ICD-10-CM | POA: Diagnosis not present

## 2021-08-08 ENCOUNTER — Emergency Department (HOSPITAL_COMMUNITY)
Admission: EM | Admit: 2021-08-08 | Discharge: 2021-08-08 | Disposition: A | Discharge status: Home / Self Care | Payer: 59 | Attending: Emergency Medicine | Admitting: Emergency Medicine

## 2021-08-08 ENCOUNTER — Other Ambulatory Visit: Payer: Self-pay

## 2021-08-08 DIAGNOSIS — M542 Cervicalgia: Secondary | ICD-10-CM | POA: Diagnosis not present

## 2021-08-08 DIAGNOSIS — Y92410 Unspecified street and highway as the place of occurrence of the external cause: Secondary | ICD-10-CM | POA: Diagnosis not present

## 2021-08-08 DIAGNOSIS — M545 Low back pain, unspecified: Secondary | ICD-10-CM | POA: Insufficient documentation

## 2021-08-08 MED ORDER — CYCLOBENZAPRINE HCL 10 MG PO TABS
10.0000 mg | ORAL_TABLET | Freq: Two times a day (BID) | ORAL | 0 refills | Status: AC | PRN
Start: 2021-08-08 — End: ?

## 2021-08-08 MED ORDER — IBUPROFEN 600 MG PO TABS: 600.0000 mg | ORAL_TABLET | Freq: Four times a day (QID) | ORAL | 0 refills | Status: AC | PRN
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
## 2021-08-08 NOTE — ED Notes (Signed)
Patient discharge instructions reviewed with the patient. The patient verbalized understanding of instructions patient discharged. 

## 2023-03-11 DIAGNOSIS — G5602 Carpal tunnel syndrome, left upper limb: Secondary | ICD-10-CM | POA: Diagnosis not present

## 2023-03-24 DIAGNOSIS — G5602 Carpal tunnel syndrome, left upper limb: Secondary | ICD-10-CM | POA: Diagnosis not present

## 2023-04-03 DIAGNOSIS — G5602 Carpal tunnel syndrome, left upper limb: Secondary | ICD-10-CM | POA: Diagnosis not present

## 2023-07-09 DIAGNOSIS — M109 Gout, unspecified: Secondary | ICD-10-CM | POA: Diagnosis not present

## 2023-07-09 DIAGNOSIS — Z13220 Encounter for screening for lipoid disorders: Secondary | ICD-10-CM | POA: Diagnosis not present

## 2023-07-09 DIAGNOSIS — Z125 Encounter for screening for malignant neoplasm of prostate: Secondary | ICD-10-CM | POA: Diagnosis not present

## 2023-07-09 DIAGNOSIS — Z0000 Encounter for general adult medical examination without abnormal findings: Secondary | ICD-10-CM | POA: Diagnosis not present

## 2024-02-11 ENCOUNTER — Other Ambulatory Visit: Payer: Self-pay

## 2024-02-11 ENCOUNTER — Emergency Department (HOSPITAL_COMMUNITY)
Admission: EM | Admit: 2024-02-11 | Discharge: 2024-02-12 | Disposition: A | Discharge status: Home / Self Care | Attending: Emergency Medicine | Admitting: Emergency Medicine

## 2024-02-11 ENCOUNTER — Emergency Department (HOSPITAL_COMMUNITY)

## 2024-02-11 ENCOUNTER — Encounter (HOSPITAL_COMMUNITY): Payer: Self-pay | Admitting: Emergency Medicine

## 2024-02-11 DIAGNOSIS — B338 Other specified viral diseases: Secondary | ICD-10-CM

## 2024-02-11 DIAGNOSIS — J189 Pneumonia, unspecified organism: Secondary | ICD-10-CM | POA: Insufficient documentation

## 2024-02-11 DIAGNOSIS — B974 Respiratory syncytial virus as the cause of diseases classified elsewhere: Secondary | ICD-10-CM | POA: Diagnosis not present

## 2024-02-11 DIAGNOSIS — R918 Other nonspecific abnormal finding of lung field: Secondary | ICD-10-CM | POA: Diagnosis not present

## 2024-02-11 DIAGNOSIS — R059 Cough, unspecified: Secondary | ICD-10-CM | POA: Diagnosis not present

## 2024-02-11 DIAGNOSIS — N179 Acute kidney failure, unspecified: Secondary | ICD-10-CM | POA: Diagnosis not present

## 2024-02-11 DIAGNOSIS — R0602 Shortness of breath: Secondary | ICD-10-CM | POA: Diagnosis not present

## 2024-02-11 DIAGNOSIS — J984 Other disorders of lung: Secondary | ICD-10-CM | POA: Diagnosis not present

## 2024-02-11 LAB — CBC
HCT: 42.9 % (ref 39.0–52.0)
Hemoglobin: 14.5 g/dL (ref 13.0–17.0)
MCH: 26.9 pg (ref 26.0–34.0)
MCHC: 33.8 g/dL (ref 30.0–36.0)
MCV: 79.4 fL — ABNORMAL LOW (ref 80.0–100.0)
Platelets: 151 10*3/uL (ref 150–400)
RBC: 5.4 MIL/uL (ref 4.22–5.81)
RDW: 13.9 % (ref 11.5–15.5)
WBC: 9.9 10*3/uL (ref 4.0–10.5)
nRBC: 0 % (ref 0.0–0.2)

## 2024-02-11 LAB — COMPREHENSIVE METABOLIC PANEL WITH GFR
ALT: 19 U/L (ref 0–44)
AST: 29 U/L (ref 15–41)
Albumin: 3.6 g/dL (ref 3.5–5.0)
Alkaline Phosphatase: 49 U/L (ref 38–126)
Anion gap: 9 (ref 5–15)
BUN: 16 mg/dL (ref 8–23)
CO2: 22 mmol/L (ref 22–32)
Calcium: 9 mg/dL (ref 8.9–10.3)
Chloride: 104 mmol/L (ref 98–111)
Creatinine, Ser: 1.38 mg/dL — ABNORMAL HIGH (ref 0.61–1.24)
GFR, Estimated: 58 mL/min — ABNORMAL LOW (ref >60)
Glucose, Bld: 122 mg/dL — ABNORMAL HIGH (ref 70–99)
Potassium: 3.4 mmol/L — ABNORMAL LOW (ref 3.5–5.1)
Sodium: 135 mmol/L (ref 135–145)
Total Bilirubin: 1.8 mg/dL — ABNORMAL HIGH (ref 0.0–1.2)
Total Protein: 8.4 g/dL — ABNORMAL HIGH (ref 6.5–8.1)

## 2024-02-11 LAB — RESP PANEL BY RT-PCR (RSV, FLU A&B, COVID)  RVPGX2
Influenza A by PCR: NEGATIVE
Influenza B by PCR: NEGATIVE
Resp Syncytial Virus by PCR: POSITIVE — AB
SARS Coronavirus 2 by RT PCR: NEGATIVE

## 2024-02-11 LAB — I-STAT CG4 LACTIC ACID, ED: Lactic Acid, Venous: 0.9 mmol/L (ref 0.5–1.9)

## 2024-02-11 LAB — TROPONIN I (HIGH SENSITIVITY)
Troponin I (High Sensitivity): 21 ng/L — ABNORMAL HIGH (ref <18)
Troponin I (High Sensitivity): 24 ng/L — ABNORMAL HIGH (ref <18)

## 2024-02-11 MED ORDER — SODIUM CHLORIDE 0.9 % IV SOLN
2.0000 g | Freq: Once | INTRAVENOUS | Status: AC
  Administered 2024-02-11: 2 g via INTRAVENOUS
  Filled 2024-02-11: qty 20

## 2024-02-11 MED ORDER — KETOROLAC TROMETHAMINE 15 MG/ML IJ SOLN
15.0000 mg | Freq: Once | INTRAMUSCULAR | Status: AC
  Administered 2024-02-11: 15 mg via INTRAVENOUS
  Filled 2024-02-11: qty 1

## 2024-02-11 MED ORDER — AMOXICILLIN 500 MG PO CAPS: 1000.0000 mg | ORAL_CAPSULE | Freq: Three times a day (TID) | ORAL | 0 refills | Status: AC

## 2024-02-11 MED ORDER — LACTATED RINGERS IV BOLUS
1000.0000 mL | Freq: Once | INTRAVENOUS | Status: AC
  Administered 2024-02-11: 1000 mL via INTRAVENOUS

## 2024-02-11 MED ORDER — SODIUM CHLORIDE 0.9 % IV SOLN
500.0000 mg | Freq: Once | INTRAVENOUS | Status: AC
  Administered 2024-02-11: 500 mg via INTRAVENOUS
  Filled 2024-02-11: qty 5

## 2024-02-11 MED ORDER — ACETAMINOPHEN 325 MG PO TABS
650.0000 mg | ORAL_TABLET | Freq: Once | ORAL | Status: AC | PRN
  Administered 2024-02-11: 650 mg via ORAL
  Filled 2024-02-11: qty 2

## 2024-02-11 MED ORDER — LACTATED RINGERS IV SOLN: INTRAVENOUS | Status: DC

## 2024-02-11 MED ORDER — AMOXICILLIN 500 MG PO CAPS: 1000.0000 mg | ORAL_CAPSULE | Freq: Three times a day (TID) | ORAL | 0 refills | Status: DC

## 2024-02-11 NOTE — ED Triage Notes (Signed)
 Pt reports SHOB x 3 days. Endorse cough and intermittent pain with breathing. Intermittent chills at night. Temp 103.2. Denies taking any OTC medications, Tylenol, or ibuprofen.

## 2024-02-11 NOTE — Discharge Instructions (Addendum)
 Thank you for coming to [facility] Emergency Department. You were seen for cough, fever. We did an exam, labs, and imaging, and these showed a positive RSV test (viral illness) as well as a possible left-sided pneumonia. Please take amoxicillin 1,000 mg every  8 hours for 7 days. Please drink plenty of water, as you may also be dehydrated. You can alternate taking Tylenol and ibuprofen as needed for pain. You can take 650mg  tylenol (acetaminophen) every 4-6 hours, and 600 mg ibuprofen 3 times a day.   Please follow up with your primary care provider within 1 week.   Do not hesitate to return to the ED or call 911 if you experience: -Worsening symptoms -Chest pain -Shortness of breath -Lightheadedness, passing out -Fevers/chills -Anything else that concerns you

## 2024-02-11 NOTE — ED Provider Triage Note (Signed)
 Emergency Medicine Provider Triage Evaluation Note  Drew Williamson , a 63 y.o. male  was evaluated in triage.  Pt complains of cough, shortness of breath, chest pain, chills.  Arrives to the ED temp 103.2.  Denies any known sick contacts.  Has not taken anything for the fever at home  Review of Systems  Positive: Chest pain, shob, cough, chills Negative: Nausea, vomiting  Physical Exam  BP 135/83 (BP Location: Right Arm)   Pulse (!) 109   Temp (!) 103.2 F (39.6 C) (Oral)   Resp 20   SpO2 94%  Gen:   Awake, no distress   Resp:  Normal effort  MSK:   Moves extremities without difficulty  Other:  Tachycardic, febrile in triage, no signficant wheezing, rhonchi  Medical Decision Making  Medically screening exam initiated at 3:37 PM.  Appropriate orders placed.  Drew Williamson was informed that the remainder of the evaluation will be completed by another provider, this initial triage assessment does not replace that evaluation, and the importance of remaining in the ED until their evaluation is complete.  Workup initiated in triage    Drew Williamson, [place] 02/11/24 1539

## 2024-02-11 NOTE — Sepsis Progress Note (Signed)
 eLink is following this Code Sepsis.

## 2024-02-11 NOTE — ED Notes (Signed)
 2nd culture set underfilled due to blood flow.

## 2024-02-11 NOTE — ED Provider Notes (Signed)
 [place] EMERGENCY DEPARTMENT AT [facility] Provider Note   CSN: [id number] Arrival date & time: 02/11/24  1507     History {Add pertinent medical, surgical, social history, OB history to HPI:1} Chief Complaint  Patient presents with   Shortness of Breath    Drew Williamson is a 63 y.o. male with PMH as listed below who presents with fatigue and cough since Monday. Also has had some CP/SOB/back pain that occur with coughing, otherwise has no chest pain.   Arrives to the ED temp 103.24F.  Denies any known sick contacts.  Has not taken anything for the fever at home. Denies N/V/D, sore throat. Also endorses mild headache.    History reviewed. No pertinent past medical history.     Home Medications Prior to Admission medications   Medication Sig Start Date End Date Taking? Authorizing Provider  cyclobenzaprine (FLEXERIL) 10 MG tablet Take 1 tablet (10 mg total) by mouth 2 (two) times daily as needed for muscle spasms. 08/08/21   Fayrene Helper, PA-C  ibuprofen (ADVIL) 600 MG tablet Take 1 tablet (600 mg total) by mouth every 6 (six) hours as needed for moderate pain. 08/08/21   Fayrene Helper, PA-C      Allergies    Patient has no known allergies.    Review of Systems   Review of Systems A 10 point review of systems was performed and is negative unless otherwise reported in HPI.  Physical Exam Updated Vital Signs BP 135/83 (BP Location: Right Arm)   Pulse (!) 109   Temp (!) 103.2 F (39.6 C) (Oral)   Resp 20   SpO2 94%  Physical Exam General: Normal appearing male, lying in bed.  HEENT: PERRLA, Sclera anicteric, MMM, trachea midline.  Cardiology: RRR, no murmurs/rubs/gallops. BL radial and DP pulses equal bilaterally.  Resp: Normal respiratory rate and effort. CTAB, no wheezes, rhonchi, crackles.  Abd: Soft, non-tender, non-distended. No rebound tenderness or guarding.  GU: Deferred. MSK: No peripheral edema or signs of trauma. Extremities without deformity or TTP.  No cyanosis or clubbing. Skin: warm, dry. No rashes or lesions. Back: No CVA tenderness Neuro: A&Ox4, CNs II-XII grossly intact. MAEs. Sensation grossly intact.  Psych: Normal mood and affect.   ED Results / Procedures / Treatments   Labs (all labs ordered are listed, but only abnormal results are displayed) Labs Reviewed  RESP PANEL BY RT-PCR (RSV, FLU A&B, COVID)  RVPGX2 - Abnormal; Notable for the following components:      Result Value   Resp Syncytial Virus by PCR POSITIVE (*)    All other components within normal limits  COMPREHENSIVE METABOLIC PANEL WITH GFR - Abnormal; Notable for the following components:   Potassium 3.4 (*)    Glucose, Bld 122 (*)    Creatinine, Ser 1.38 (*)    Total Protein 8.4 (*)    Total Bilirubin 1.8 (*)    GFR, Estimated 58 (*)    All other components within normal limits  CBC - Abnormal; Notable for the following components:   MCV 79.4 (*)    All other components within normal limits  TROPONIN I (HIGH SENSITIVITY) - Abnormal; Notable for the following components:   Troponin I (High Sensitivity) 21 (*)    All other components within normal limits  CULTURE, BLOOD (ROUTINE X 2)  CULTURE, BLOOD (ROUTINE X 2)  I-STAT CG4 LACTIC ACID, ED  I-STAT CG4 LACTIC ACID, ED  TROPONIN I (HIGH SENSITIVITY)    EKG None  Radiology DG Chest  1 View Result Date: 02/11/2024 CLINICAL DATA:  Shortness of breath EXAM: CHEST  1 VIEW COMPARISON:  12/13/2013 FINDINGS: Chronic volume loss on the left with chronic pleuroparenchymal changes. Heterogeneous airspace disease at the right base. Overall diffuse increased hazy left-sided pulmonary opacity compared to prior. Findings suspect for acute infection. Stable cardiomediastinal silhouette. No pneumothorax IMPRESSION: Chronic volume loss on the left with chronic pleuroparenchymal changes. Heterogeneous airspace disease at the right base and overall diffuse increased hazy left-sided pulmonary opacity compared to prior,  suspect for acute infection. Imaging follow-up to resolution is recommended Electronically Signed   By: Jasmine Pang M.D.   On: 02/11/2024 16:27    Procedures Procedures  {Document cardiac monitor, telemetry assessment procedure when appropriate:1}  Medications Ordered in ED Medications  acetaminophen (TYLENOL) tablet 650 mg (650 mg Oral Given 02/11/24 1547)    ED Course/ Medical Decision Making/ A&P                          Medical Decision Making Amount and/or Complexity of Data Reviewed Labs: ordered. Decision-making details documented in ED Course. Radiology:  Decision-making details documented in ED Course.  Risk OTC drugs. Prescription drug management.    This patient presents to the ED for concern of ***, this involves an extensive number of treatment options, and is a complaint that carries with it a high risk of complications and morbidity.  I considered the following differential and admission for this acute, potentially life threatening condition.   MDM:    DDX for dyspnea includes but is not limited to:  Flu-like symptoms, patient tests positive for RSV. He has fever and tachycardia with mild tachypnea on arrival, so blood cultures drawn and lactate is taken which is reassuringly normal. His HR decreases appropriately with fluid. He has clear lung sounds, no wheezing/crackles to indicate COPD/asthma or pulm edema, but his CXR does show possible opacity. He doesn't have a leukocytosis but given his vitals signs will give ceftriaxone/azithromycin. Patient feels improved after fluids/abx. EKG doesn't show any signs of ischemia. His Cr is 1.38 with no comparison must assume AKI and likely dehydration. Considered PE but not the most likely diagnosis. ***    Clinical Course as of 02/11/24 1747  Thu Feb 11, 2024  1718 Respiratory Syncytial Virus by PCR(!): POSITIVE [HN]  1719 DG Chest 1 View Chronic volume loss on the left with chronic pleuroparenchymal changes.  Heterogeneous airspace disease at the right base and overall diffuse increased hazy left-sided pulmonary opacity compared to prior, suspect for acute infection. Imaging follow-up to resolution is recommended   [HN]  1721 CBC(!) wnl [HN]  1722 Creatinine(!): 1.38 No prior for comparison [HN]    Clinical Course User Index [HN] Loetta Rough, MD    Labs: I Ordered, and personally interpreted labs.  The pertinent results include:  those listed above  Imaging Studies ordered: I ordered imaging studies including CXR I independently visualized and interpreted imaging. I agree with the radiologist interpretation  Additional history obtained from ***.  External records from outside source obtained and reviewed including ***  Cardiac Monitoring: The patient was maintained on a cardiac monitor.  I personally viewed and interpreted the cardiac monitored which showed an underlying rhythm of: ***  Reevaluation: After the interventions noted above, I reevaluated the patient and found that they have :{resolved/improved/worsened:23923::"improved"}  Social Determinants of Health: ***  Disposition:  ***  Co morbidities that complicate the patient evaluation History reviewed. No pertinent past  medical history.   Medicines Meds ordered this encounter  Medications   acetaminophen (TYLENOL) tablet 650 mg    I have reviewed the patients home medicines and have made adjustments as needed  Problem List / ED Course: Problem List Items Addressed This Visit   None        {Document critical care time when appropriate:1} {Document review of labs and clinical decision tools ie heart score, Chads2Vasc2 etc:1}  {Document your independent review of radiology images, and any outside records:1} {Document your discussion with family members, caretakers, and with consultants:1} {Document social determinants of health affecting pt's care:1} {Document your decision making why or why not admission,  treatments were needed:1}  This note was created using dictation software, which may contain spelling or grammatical errors.

## 2024-02-12 ENCOUNTER — Telehealth: Payer: Self-pay | Admitting: *Deleted

## 2024-02-12 NOTE — Telephone Encounter (Signed)
 Pharmacy called related to Rx: amoxicillin sig .Marland KitchenMarland KitchenEDCM clarified with EDP  to change Rx to: #42.

## 2024-02-16 LAB — CULTURE, BLOOD (ROUTINE X 2)
Culture: NO GROWTH
Culture: NO GROWTH
Special Requests: ADEQUATE

## 2024-02-19 DIAGNOSIS — N289 Disorder of kidney and ureter, unspecified: Secondary | ICD-10-CM | POA: Diagnosis not present

## 2024-02-19 DIAGNOSIS — J210 Acute bronchiolitis due to respiratory syncytial virus: Secondary | ICD-10-CM | POA: Diagnosis not present

## 2024-02-19 DIAGNOSIS — R9389 Abnormal findings on diagnostic imaging of other specified body structures: Secondary | ICD-10-CM | POA: Diagnosis not present

## 2024-03-11 DIAGNOSIS — R9389 Abnormal findings on diagnostic imaging of other specified body structures: Secondary | ICD-10-CM | POA: Diagnosis not present

## 2024-03-11 DIAGNOSIS — J189 Pneumonia, unspecified organism: Secondary | ICD-10-CM | POA: Diagnosis not present

## 2024-07-13 DIAGNOSIS — M25511 Pain in right shoulder: Secondary | ICD-10-CM | POA: Diagnosis not present

## 2024-07-28 DIAGNOSIS — Z0000 Encounter for general adult medical examination without abnormal findings: Secondary | ICD-10-CM | POA: Diagnosis not present

## 2024-07-28 DIAGNOSIS — Z13220 Encounter for screening for lipoid disorders: Secondary | ICD-10-CM | POA: Diagnosis not present

## 2024-07-28 DIAGNOSIS — Z23 Encounter for immunization: Secondary | ICD-10-CM | POA: Diagnosis not present

## 2024-07-28 DIAGNOSIS — M109 Gout, unspecified: Secondary | ICD-10-CM | POA: Diagnosis not present

## 2024-07-28 DIAGNOSIS — Z125 Encounter for screening for malignant neoplasm of prostate: Secondary | ICD-10-CM | POA: Diagnosis not present

## 2024-08-01 DIAGNOSIS — M67911 Unspecified disorder of synovium and tendon, right shoulder: Secondary | ICD-10-CM | POA: Diagnosis not present
